# Patient Record
Sex: Female | Born: 1985 | Race: White | Hispanic: No | Marital: Married | Smoking: Never smoker
Health system: Southern US, Community
[De-identification: ages and names within clinical notes are randomized; demographics above are authoritative.]

---

## 2016-11-27 ENCOUNTER — Ambulatory Visit (HOSPITAL_COMMUNITY)
Admission: EM | Admit: 2016-11-27 | Discharge: 2016-11-27 | Disposition: A | Discharge status: Home / Self Care | Payer: Self-pay | Attending: Internal Medicine | Admitting: Internal Medicine

## 2016-11-27 ENCOUNTER — Ambulatory Visit (INDEPENDENT_AMBULATORY_CARE_PROVIDER_SITE_OTHER): Payer: Self-pay

## 2016-11-27 ENCOUNTER — Encounter (HOSPITAL_COMMUNITY): Payer: Self-pay | Admitting: Emergency Medicine

## 2016-11-27 DIAGNOSIS — M25571 Pain in right ankle and joints of right foot: Secondary | ICD-10-CM

## 2016-11-27 MED ORDER — NAPROXEN 500 MG PO TABS: 500.0000 mg | ORAL_TABLET | Freq: Two times a day (BID) | ORAL | 0 refills | Status: AC

## 2016-11-27 NOTE — ED Triage Notes (Signed)
Pt reports she inj her right ankle last night  Reports she hit it against a wooden rocking chair.   Sx today include: swelling and pain  Brought back on wheel chair  A&O x4... NAD.

## 2016-11-27 NOTE — ED Provider Notes (Signed)
CSN: [id number]     Arrival date & time 11/27/16  1000 History   None    Chief Complaint  Patient presents with  . Ankle Injury   (Consider location/radiation/quality/duration/timing/severity/associated sxs/prior Treatment) Patient c/o right ankle injury last night.  She has pain and swelling in her medial lateral malleolus.   The history is provided by the patient.  Ankle Injury  This is a new problem. The current episode started 12 to 24 hours ago. The problem occurs constantly. The problem has not changed since onset.Nothing aggravates the symptoms. Nothing relieves the symptoms. She has tried nothing for the symptoms.    History reviewed. No pertinent past medical history. History reviewed. No pertinent surgical history. History reviewed. No pertinent family history. Social History  Substance Use Topics  . Smoking status: Never Smoker  . Smokeless tobacco: Never Used  . Alcohol use Yes   OB History    No data available     Review of Systems  Constitutional: Negative.   HENT: Negative.   Eyes: Negative.   Respiratory: Negative.   Cardiovascular: Negative.   Gastrointestinal: Negative.   Endocrine: Negative.   Genitourinary: Negative.   Musculoskeletal: Positive for arthralgias.  Allergic/Immunologic: Negative.   Neurological: Negative.   Hematological: Negative.   Psychiatric/Behavioral: Negative.     Allergies  Patient has no known allergies.  Home Medications   Prior to Admission medications   Medication Sig Start Date End Date Taking? Authorizing Provider  naproxen (NAPROSYN) 500 MG tablet Take 1 tablet (500 mg total) by mouth 2 (two) times daily with a meal. 11/27/16   [place], Anselm PancoastWilliam J, FNP   Meds Ordered and Administered this Visit  Medications - No data to display  BP (!) 110/57 (BP Location: Left Arm)   Pulse 86   Temp 98.2 F (36.8 C) (Oral)   Resp 20   LMP 11/22/2016   SpO2 100%  No data found.   Physical Exam  Constitutional: She  appears well-developed and well-nourished.  HENT:  Head: Normocephalic and atraumatic.  Eyes: Conjunctivae and EOM are normal. Pupils are equal, round, and reactive to light.  Neck: Normal range of motion. Neck supple.  Cardiovascular: Normal rate, regular rhythm and normal heart sounds.   Pulmonary/Chest: Effort normal and breath sounds normal.  Musculoskeletal: She exhibits tenderness.  TTP right lateral malleolus. Swelling right lateral malleolus.  Nursing note and vitals reviewed.   Urgent Care Course     Procedures (including critical care time)  Labs Review Labs Reviewed - No data to display  Imaging Review Dg Ankle Complete Right  Result Date: 11/27/2016 CLINICAL DATA:  Right ankle injury during a fall last night. Lateral ankle discomfort. History of a previous ligamentous injury of the ankle. EXAM: RIGHT ANKLE - COMPLETE 3+ VIEW COMPARISON:  None in PACs FINDINGS: The bones are subjectively adequately mineralized. There is no acute fracture nor dislocation. The joint mortise is preserved. The talar dome is intact. The remainder of the talus as well as the calcaneus appear normal. The metatarsal bases are intact where visualized. There is mild soft tissue swelling laterally. IMPRESSION: There is no acute or significant chronic bony abnormality of the right ankle. Electronically Signed   By: David  SwazilandJordan M.D.   On: 11/27/2016 10:40     Visual Acuity Review  Right Eye Distance:   Left Eye Distance:   Bilateral Distance:    Right Eye Near:   Left Eye Near:    Bilateral Near:  MDM   1. Acute right ankle pain    Cam Walker right ankle Naprosyn 500mg  one po bid x 10 days #20      Deatra Canter, FNP 11/27/16 1106

## 2018-01-05 IMAGING — DX DG ANKLE COMPLETE 3+V*R*
3 series · 3 of 3 positions shown · non-contrast
Comparison: None in PACs

CLINICAL DATA: Right ankle injury during a fall last night. Lateral
ankle discomfort. History of a previous ligamentous injury of the
ankle.

EXAM:
RIGHT ANKLE - COMPLETE 3+ VIEW

[ankle ap]
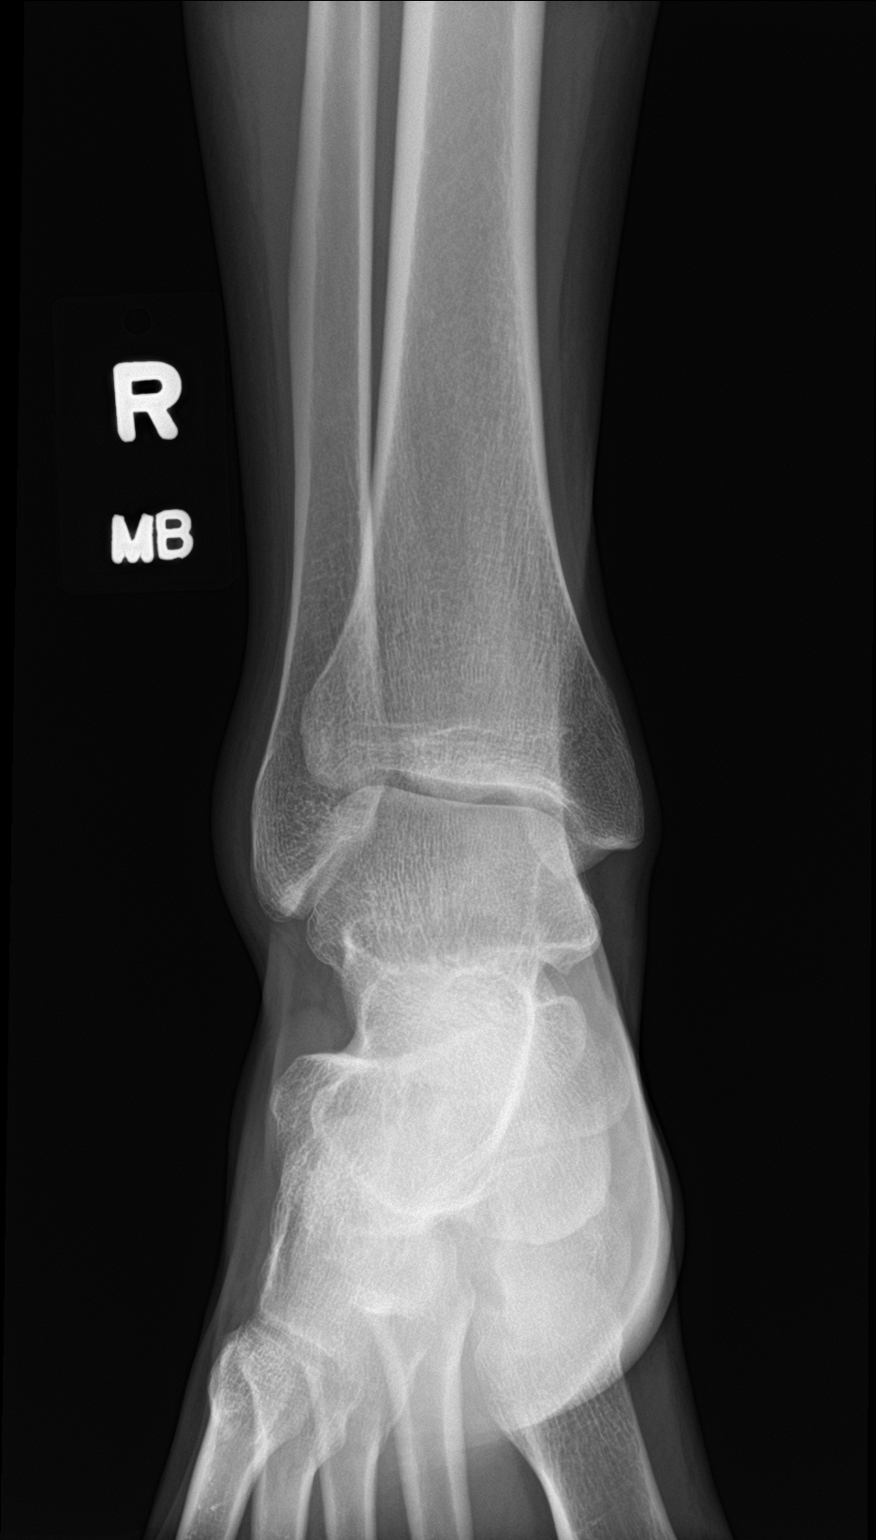

[ankle obl]
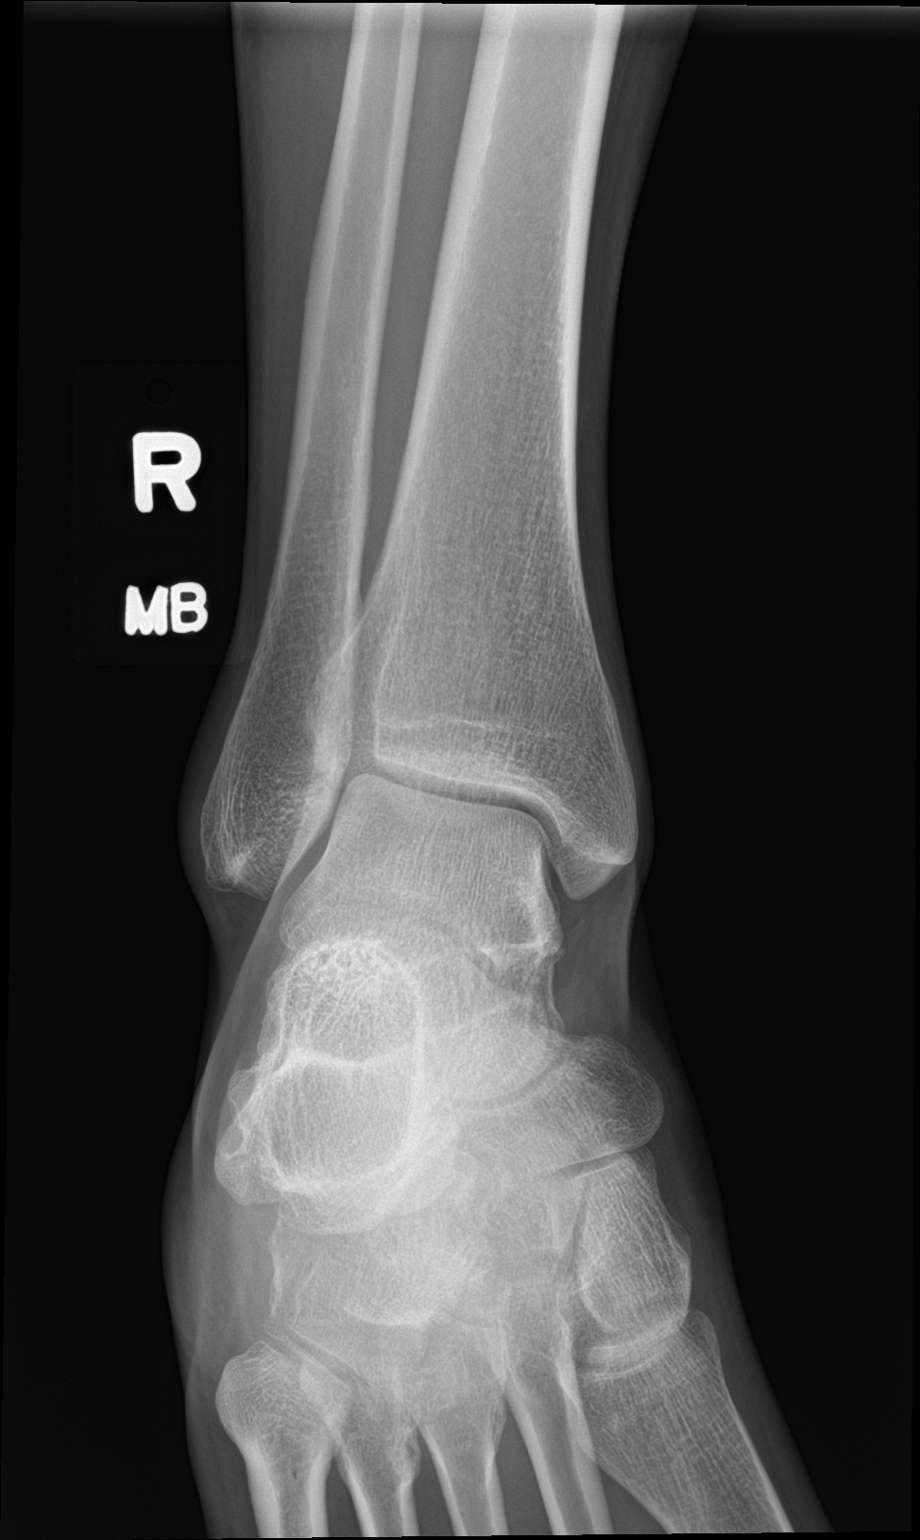

[ankle lat]
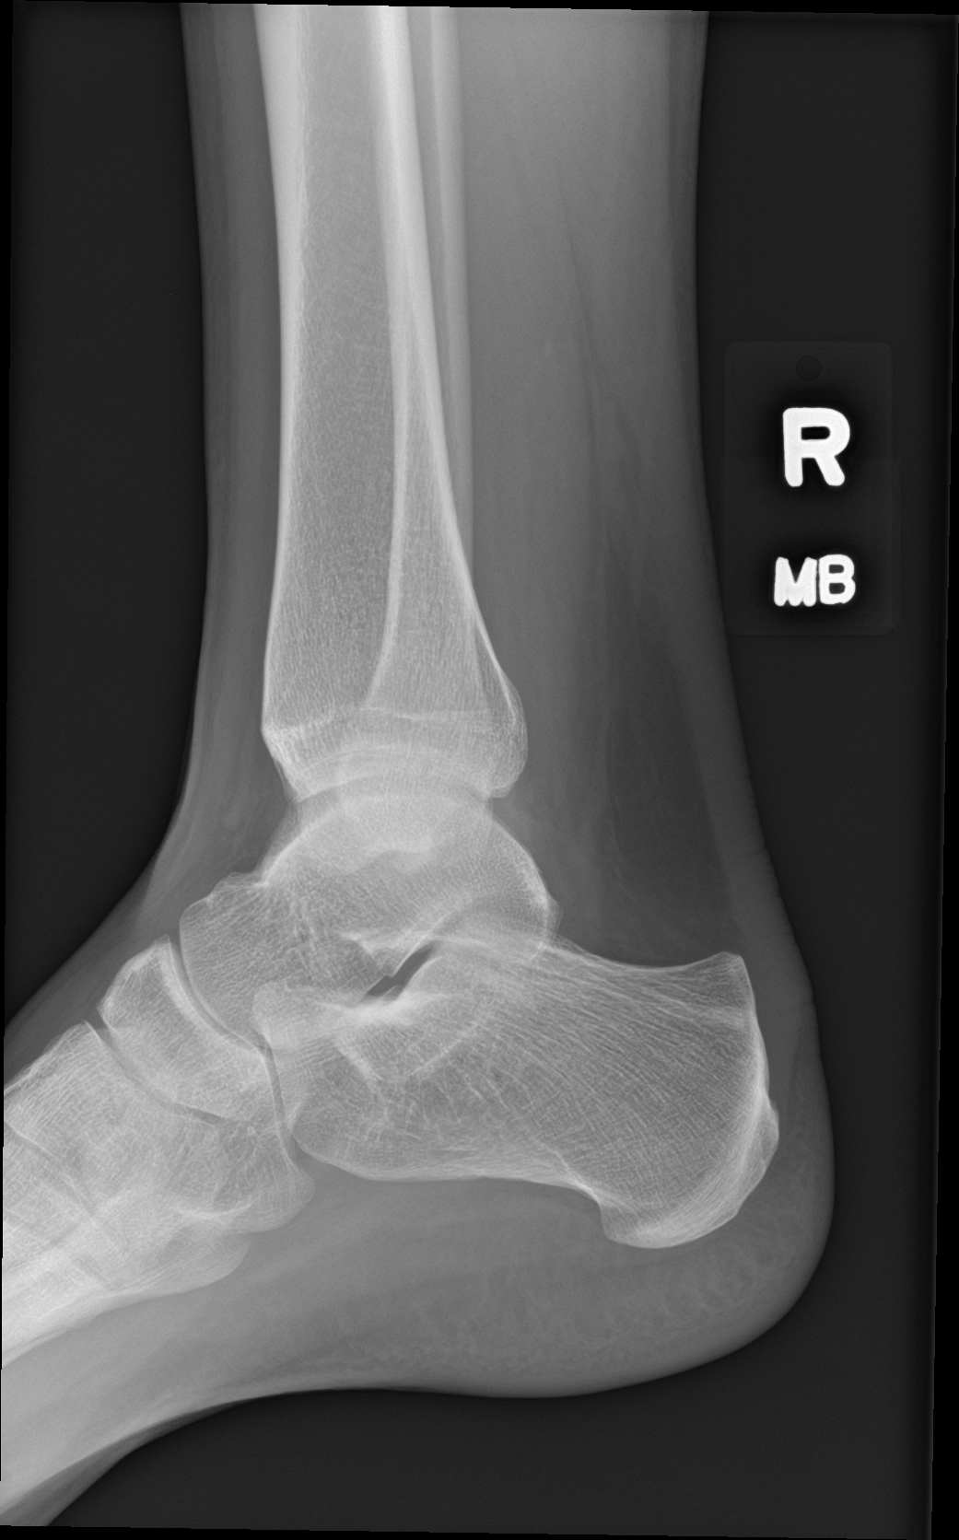

[3 of 3 positions shown; findings below may reference images not displayed]

FINDINGS: The bones are subjectively adequately mineralized. There is no acute
fracture nor dislocation. The joint mortise is preserved. The talar
dome is intact. The remainder of the talus as well as the calcaneus
appear normal. The metatarsal bases are intact where visualized.
There is mild soft tissue swelling laterally.
IMPRESSION: There is no acute or significant chronic bony abnormality of the
right ankle.
